# Patient Record
Sex: Male | Born: 1965
Health system: Southern US, Community
[De-identification: ages and names within clinical notes are randomized; demographics above are authoritative.]

## PROBLEM LIST (undated history)

## (undated) DIAGNOSIS — F1010 Alcohol abuse, uncomplicated: Secondary | ICD-10-CM

## (undated) DIAGNOSIS — E785 Hyperlipidemia, unspecified: Secondary | ICD-10-CM

## (undated) DIAGNOSIS — Z9081 Acquired absence of spleen: Secondary | ICD-10-CM

## (undated) DIAGNOSIS — F32A Depression, unspecified: Secondary | ICD-10-CM

## (undated) DIAGNOSIS — F329 Major depressive disorder, single episode, unspecified: Secondary | ICD-10-CM

## (undated) DIAGNOSIS — Z5189 Encounter for other specified aftercare: Secondary | ICD-10-CM

## (undated) HISTORY — PX: WISDOM TOOTH EXTRACTION: SHX21

## (undated) HISTORY — DX: Acquired absence of spleen: Z90.81

## (undated) HISTORY — DX: Hyperlipidemia, unspecified: E78.5

## (undated) HISTORY — DX: Alcohol abuse, uncomplicated: F10.10

## (undated) HISTORY — DX: Major depressive disorder, single episode, unspecified: F32.9

## (undated) HISTORY — DX: Encounter for other specified aftercare: Z51.89

## (undated) HISTORY — DX: Depression, unspecified: F32.A

---
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
## 2015-04-02 ENCOUNTER — Ambulatory Visit (INDEPENDENT_AMBULATORY_CARE_PROVIDER_SITE_OTHER): Payer: 59

## 2015-04-02 ENCOUNTER — Ambulatory Visit (INDEPENDENT_AMBULATORY_CARE_PROVIDER_SITE_OTHER): Payer: 59 | Admitting: Internal Medicine

## 2015-04-02 VITALS — BP 128/82 | HR 63 | Temp 98.4°F | Resp 18 | Ht 70.0 in | Wt 171.0 lb

## 2015-04-02 DIAGNOSIS — M79671 Pain in right foot: Secondary | ICD-10-CM

## 2015-04-02 DIAGNOSIS — S92301A Fracture of unspecified metatarsal bone(s), right foot, initial encounter for closed fracture: Secondary | ICD-10-CM | POA: Diagnosis not present

## 2015-04-02 NOTE — Progress Notes (Signed)
   Subjective:  This chart was scribed for Jerry Pearsonobert P Zhania Shaheen, MD by Jerry Harrison, ED Scribe. The patient was seen in room 11. Patient's care was started at 9:03 AM.   Patient ID: Jerry Harrison, male    DOB: 1966/06/10, 49 y.o.   MRN: [id number]  Chief Complaint  Patient presents with  . Foot Swelling    right foot, pain, x 3 days   HPI HPI Comments: Jerry Harrison is a 49 y.o. male who presents to the [facility] complaining of persistent R foot pain for the past 3 days. Pt states that he tripped and twisted his foot 3 days ago. Pain is exacerbated with bearing weight. He reports associated swelling to his R foot.    History reviewed. No pertinent past medical history. No current outpatient prescriptions on file prior to visit.   No current facility-administered medications on file prior to visit.   No Known Allergies   No PCP--need for care in several yrs  Review of Systems  Musculoskeletal: Positive for joint swelling and arthralgias.  BP 128/82 mmHg  Pulse 63  Temp(Src) 98.4 F (36.9 C) (Oral)  Resp 18  Ht 5\' 10"  (1.778 m)  Wt 171 lb (77.565 kg)  BMI 24.54 kg/m2  SpO2 100%    Objective:   Physical Exam  Constitutional: He is oriented to person, place, and time. He appears well-developed and well-nourished.  HENT:  Head: Normocephalic and atraumatic.  Cardiovascular: Normal rate.   Pulmonary/Chest: Effort normal.  Musculoskeletal: Normal range of motion.  R foot is swollen over the lateral dorsal area with Tenderness to palpation approximately over the forth and fifth MTs. Ankle ROM is full without pain. Achilles intact. Fibula is nontender.   Neurological: He is alert and oriented to person, place, and time.  Skin: Skin is warm and dry.  Psychiatric: He has a normal mood and affect. His behavior is normal.  Nursing note and vitals reviewed. BP 128/82 mmHg  Pulse 63  Temp(Src) 98.4 F (36.9 C) (Oral)  Resp 18  Ht 5\' 10"  (1.778 m)  Wt 171 lb  (77.565 kg)  BMI 24.54 kg/m2  SpO2 100% [facility] reading (PRIMARY) by  Dr. Garth Schlatteroolittle=spiral fx 5th MT      Assessment & Plan:   Fx metatarsal, right, closed, initial encounter - Plan: Ambulatory referral to Orthopedic Surgery  Right foot pain - Plan: DG Foot Complete Right  Dr August Saucerean at 2:30pm

## 2015-11-11 ENCOUNTER — Ambulatory Visit (INDEPENDENT_AMBULATORY_CARE_PROVIDER_SITE_OTHER): Payer: 59 | Admitting: Physician Assistant

## 2015-11-11 ENCOUNTER — Encounter: Payer: Self-pay | Admitting: Physician Assistant

## 2015-11-11 VITALS — BP 132/86 | HR 72 | Temp 98.4°F | Resp 16 | Ht 70.0 in | Wt 175.1 lb

## 2015-11-11 DIAGNOSIS — Z0000 Encounter for general adult medical examination without abnormal findings: Secondary | ICD-10-CM

## 2015-11-11 DIAGNOSIS — Z23 Encounter for immunization: Secondary | ICD-10-CM

## 2015-11-11 NOTE — Patient Instructions (Signed)
Please schedule a lab appointment on your way out. I will call you with your results.  We will treat abnormal findings. Stay well hydrated and stay active.  Follow-up will based on lab results.  Preventive Care for Adults, Male A healthy lifestyle and preventive care can promote health and wellness. Preventive health guidelines for men include the following key practices:  A routine yearly physical is a good way to check with your health care provider about your health and preventative screening. It is a chance to share any concerns and updates on your health and to receive a thorough exam.  Visit your dentist for a routine exam and preventative care every 6 months. Brush your teeth twice a day and floss once a day. Good oral hygiene prevents tooth decay and gum disease.  The frequency of eye exams is based on your age, health, family medical history, use of contact lenses, and other factors. Follow your health care provider's recommendations for frequency of eye exams.  Eat a healthy diet. Foods such as vegetables, fruits, whole grains, low-fat dairy products, and lean protein foods contain the nutrients you need without too many calories. Decrease your intake of foods high in solid fats, added sugars, and salt. Eat the right amount of calories for you.Get information about a proper diet from your health care provider, if necessary.  Regular physical exercise is one of the most important things you can do for your health. Most adults should get at least 150 minutes of moderate-intensity exercise (any activity that increases your heart rate and causes you to sweat) each week. In addition, most adults need muscle-strengthening exercises on 2 or more days a week.  Maintain a healthy weight. The body mass index (BMI) is a screening tool to identify possible weight problems. It provides an estimate of body fat based on height and weight. Your health care provider can find your BMI and can help you  achieve or maintain a healthy weight.For adults 20 years and older:  A BMI below 18.5 is considered underweight.  A BMI of 18.5 to 24.9 is normal.  A BMI of 25 to 29.9 is considered overweight.  A BMI of 30 and above is considered obese.  Maintain normal blood lipids and cholesterol levels by exercising and minimizing your intake of saturated fat. Eat a balanced diet with plenty of fruit and vegetables. Blood tests for lipids and cholesterol should begin at age 20 and be repeated every 5 years. If your lipid or cholesterol levels are high, you are over 50, or you are at high risk for heart disease, you may need your cholesterol levels checked more frequently.Ongoing high lipid and cholesterol levels should be treated with medicines if diet and exercise are not working.  If you smoke, find out from your health care provider how to quit. If you do not use tobacco, do not start.  Lung cancer screening is recommended for adults aged 55-80 years who are at high risk for developing lung cancer because of a history of smoking. A yearly low-dose CT scan of the lungs is recommended for people who have at least a 30-pack-year history of smoking and are a current smoker or have quit within the past 15 years. A pack year of smoking is smoking an average of 1 pack of cigarettes a day for 1 year (for example: 1 pack a day for 30 years or 2 packs a day for 15 years). Yearly screening should continue until the smoker has stopped smoking for   at least 15 years. Yearly screening should be stopped for people who develop a health problem that would prevent them from having lung cancer treatment.  If you choose to drink alcohol, do not have more than 2 drinks per day. One drink is considered to be 12 ounces (355 mL) of beer, 5 ounces (148 mL) of wine, or 1.5 ounces (44 mL) of liquor.  Avoid use of street drugs. Do not share needles with anyone. Ask for help if you need support or instructions about stopping the use of  drugs.  High blood pressure causes heart disease and increases the risk of stroke. Your blood pressure should be checked at least every 1-2 years. Ongoing high blood pressure should be treated with medicines, if weight loss and exercise are not effective.  If you are 45-79 years old, ask your health care provider if you should take aspirin to prevent heart disease.  Diabetes screening is done by taking a blood sample to check your blood glucose level after you have not eaten for a certain period of time (fasting). If you are not overweight and you do not have risk factors for diabetes, you should be screened once every 3 years starting at age 45. If you are overweight or obese and you are 40-70 years of age, you should be screened for diabetes every year as part of your cardiovascular risk assessment.  Colorectal cancer can be detected and often prevented. Most routine colorectal cancer screening begins at the age of 50 and continues through age 75. However, your health care provider may recommend screening at an earlier age if you have risk factors for colon cancer. On a yearly basis, your health care provider may provide home test kits to check for hidden blood in the stool. Use of a small camera at the end of a tube to directly examine the colon (sigmoidoscopy or colonoscopy) can detect the earliest forms of colorectal cancer. Talk to your health care provider about this at age 50, when routine screening begins. Direct exam of the colon should be repeated every 5-10 years through age 75, unless early forms of precancerous polyps or small growths are found.  People who are at an increased risk for hepatitis B should be screened for this virus. You are considered at high risk for hepatitis B if:  You were born in a country where hepatitis B occurs often. Talk with your health care provider about which countries are considered high risk.  Your parents were born in a high-risk country and you have not  received a shot to protect against hepatitis B (hepatitis B vaccine).  You have HIV or AIDS.  You use needles to inject street drugs.  You live with, or have sex with, someone who has hepatitis B.  You are a man who has sex with other men (MSM).  You get hemodialysis treatment.  You take certain medicines for conditions such as cancer, organ transplantation, and autoimmune conditions.  Hepatitis C blood testing is recommended for all people born from 1945 through 1965 and any individual with known risks for hepatitis C.  Practice safe sex. Use condoms and avoid high-risk sexual practices to reduce the spread of sexually transmitted infections (STIs). STIs include gonorrhea, chlamydia, syphilis, trichomonas, herpes, HPV, and human immunodeficiency virus (HIV). Herpes, HIV, and HPV are viral illnesses that have no cure. They can result in disability, cancer, and death.  If you are a man who has sex with other men, you should be screened at   least once per year for:  HIV.  Urethral, rectal, and pharyngeal infection of gonorrhea, chlamydia, or both.  If you are at risk of being infected with HIV, it is recommended that you take a prescription medicine daily to prevent HIV infection. This is called preexposure prophylaxis (PrEP). You are considered at risk if:  You are a man who has sex with other men (MSM) and have other risk factors.  You are a heterosexual man, are sexually active, and are at increased risk for HIV infection.  You take drugs by injection.  You are sexually active with a partner who has HIV.  Talk with your health care provider about whether you are at high risk of being infected with HIV. If you choose to begin PrEP, you should first be tested for HIV. You should then be tested every 3 months for as long as you are taking PrEP.  A one-time screening for abdominal aortic aneurysm (AAA) and surgical repair of large AAAs by ultrasound are recommended for men ages 65 to  75 years who are current or former smokers.  Healthy men should no longer receive prostate-specific antigen (PSA) blood tests as part of routine cancer screening. Talk with your health care provider about prostate cancer screening.  Testicular cancer screening is not recommended for adult males who have no symptoms. Screening includes self-exam, a health care provider exam, and other screening tests. Consult with your health care provider about any symptoms you have or any concerns you have about testicular cancer.  Use sunscreen. Apply sunscreen liberally and repeatedly throughout the day. You should seek shade when your shadow is shorter than you. Protect yourself by wearing long sleeves, pants, a wide-brimmed hat, and sunglasses year round, whenever you are outdoors.  Once a month, do a whole-body skin exam, using a mirror to look at the skin on your back. Tell your health care provider about new moles, moles that have irregular borders, moles that are larger than a pencil eraser, or moles that have changed in shape or color.  Stay current with required vaccines (immunizations).  Influenza vaccine. All adults should be immunized every year.  Tetanus, diphtheria, and acellular pertussis (Td, Tdap) vaccine. An adult who has not previously received Tdap or who does not know his vaccine status should receive 1 dose of Tdap. This initial dose should be followed by tetanus and diphtheria toxoids (Td) booster doses every 10 years. Adults with an unknown or incomplete history of completing a 3-dose immunization series with Td-containing vaccines should begin or complete a primary immunization series including a Tdap dose. Adults should receive a Td booster every 10 years.  Varicella vaccine. An adult without evidence of immunity to varicella should receive 2 doses or a second dose if he has previously received 1 dose.  Human papillomavirus (HPV) vaccine. Males aged 11-21 years who have not received the  vaccine previously should receive the 3-dose series. Males aged 22-26 years may be immunized. Immunization is recommended through the age of 26 years for any male who has sex with males and did not get any or all doses earlier. Immunization is recommended for any person with an immunocompromised condition through the age of 26 years if he did not get any or all doses earlier. During the 3-dose series, the second dose should be obtained 4-8 weeks after the first dose. The third dose should be obtained 24 weeks after the first dose and 16 weeks after the second dose.  Zoster vaccine. One dose is   recommended for adults aged 60 years or older unless certain conditions are present.  Measles, mumps, and rubella (MMR) vaccine. Adults born before 1957 generally are considered immune to measles and mumps. Adults born in 1957 or later should have 1 or more doses of MMR vaccine unless there is a contraindication to the vaccine or there is laboratory evidence of immunity to each of the three diseases. A routine second dose of MMR vaccine should be obtained at least 28 days after the first dose for students attending postsecondary schools, health care workers, or international travelers. People who received inactivated measles vaccine or an unknown type of measles vaccine during 1963-1967 should receive 2 doses of MMR vaccine. People who received inactivated mumps vaccine or an unknown type of mumps vaccine before 1979 and are at high risk for mumps infection should consider immunization with 2 doses of MMR vaccine. Unvaccinated health care workers born before 1957 who lack laboratory evidence of measles, mumps, or rubella immunity or laboratory confirmation of disease should consider measles and mumps immunization with 2 doses of MMR vaccine or rubella immunization with 1 dose of MMR vaccine.  Pneumococcal 13-valent conjugate (PCV13) vaccine. When indicated, a person who is uncertain of his immunization history and has no  record of immunization should receive the PCV13 vaccine. All adults 65 years of age and older should receive this vaccine. An adult aged 19 years or older who has certain medical conditions and has not been previously immunized should receive 1 dose of PCV13 vaccine. This PCV13 should be followed with a dose of pneumococcal polysaccharide (PPSV23) vaccine. Adults who are at high risk for pneumococcal disease should obtain the PPSV23 vaccine at least 8 weeks after the dose of PCV13 vaccine. Adults older than 48 years of age who have normal immune system function should obtain the PPSV23 vaccine dose at least 1 year after the dose of PCV13 vaccine.  Pneumococcal polysaccharide (PPSV23) vaccine. When PCV13 is also indicated, PCV13 should be obtained first. All adults aged 65 years and older should be immunized. An adult younger than age 65 years who has certain medical conditions should be immunized. Any person who resides in a nursing home or long-term care facility should be immunized. An adult smoker should be immunized. People with an immunocompromised condition and certain other conditions should receive both PCV13 and PPSV23 vaccines. People with human immunodeficiency virus (HIV) infection should be immunized as soon as possible after diagnosis. Immunization during chemotherapy or radiation therapy should be avoided. Routine use of PPSV23 vaccine is not recommended for American Indians, [place] Natives, or people younger than 65 years unless there are medical conditions that require PPSV23 vaccine. When indicated, people who have unknown immunization and have no record of immunization should receive PPSV23 vaccine. One-time revaccination 5 years after the first dose of PPSV23 is recommended for people aged 19-64 years who have chronic kidney failure, nephrotic syndrome, asplenia, or immunocompromised conditions. People who received 1-2 doses of PPSV23 before age 65 years should receive another dose of PPSV23  vaccine at age 65 years or later if at least 5 years have passed since the previous dose. Doses of PPSV23 are not needed for people immunized with PPSV23 at or after age 65 years.  Meningococcal vaccine. Adults with asplenia or persistent complement component deficiencies should receive 2 doses of quadrivalent meningococcal conjugate (MenACWY-D) vaccine. The doses should be obtained at least 2 months apart. Microbiologists working with certain meningococcal bacteria, military recruits, people at risk during an outbreak, and   people who travel to or live in countries with a high rate of meningitis should be immunized. A first-year college student up through age 21 years who is living in a residence hall should receive a dose if he did not receive a dose on or after his 16th birthday. Adults who have certain high-risk conditions should receive one or more doses of vaccine.  Hepatitis A vaccine. Adults who wish to be protected from this disease, have chronic liver disease, work with hepatitis A-infected animals, work in hepatitis A research labs, or travel to or work in countries with a high rate of hepatitis A should be immunized. Adults who were previously unvaccinated and who anticipate close contact with an international adoptee during the first 60 days after arrival in the [place] from a country with a high rate of hepatitis A should be immunized.  Hepatitis B vaccine. Adults should be immunized if they wish to be protected from this disease, are under age 27 years and have diabetes, have chronic liver disease, have had more than one sex partner in the past 6 months, may be exposed to blood or other infectious body fluids, are household contacts or sex partners of hepatitis B positive people, are clients or workers in certain care facilities, or travel to or work in countries with a high rate of hepatitis B.  Haemophilus influenzae type b (Hib) vaccine. A previously unvaccinated person with asplenia  or sickle cell disease or having a scheduled splenectomy should receive 1 dose of Hib vaccine. Regardless of previous immunization, a recipient of a hematopoietic stem cell transplant should receive a 3-dose series 6-12 months after his successful transplant. Hib vaccine is not recommended for adults with HIV infection. Preventive Service / Frequency Ages 34 to 23  Blood pressure check.** / Every 3-5 years.  Lipid and cholesterol check.** / Every 5 years beginning at age 68.  Hepatitis C blood test.** / For any individual with known risks for hepatitis C.  Skin self-exam. / Monthly.  Influenza vaccine. / Every year.  Tetanus, diphtheria, and acellular pertussis (Tdap, Td) vaccine.** / Consult your health care provider. 1 dose of Td every 10 years.  Varicella vaccine.** / Consult your health care provider.  HPV vaccine. / 3 doses over 6 months, if 27 or younger.  Measles, mumps, rubella (MMR) vaccine.** / You need at least 1 dose of MMR if you were born in 1957 or later. You may also need a second dose.  Pneumococcal 13-valent conjugate (PCV13) vaccine.** / Consult your health care provider.  Pneumococcal polysaccharide (PPSV23) vaccine.** / 1 to 2 doses if you smoke cigarettes or if you have certain conditions.  Meningococcal vaccine.** / 1 dose if you are age 42 to 69 years and a Market researcher living in a residence hall, or have one of several medical conditions. You may also need additional booster doses.  Hepatitis A vaccine.** / Consult your health care provider.  Hepatitis B vaccine.** / Consult your health care provider.  Haemophilus influenzae type b (Hib) vaccine.** / Consult your health care provider. Ages 55 to 38  Blood pressure check.** / Every year.  Lipid and cholesterol check.** / Every 5 years beginning at age 40.  Lung cancer screening. / Every year if you are aged 39-80 years and have a 30-pack-year history of smoking and currently smoke or have  quit within the past 15 years. Yearly screening is stopped once you have quit smoking for at least 15 years or develop a health  problem that would prevent you from having lung cancer treatment.  Fecal occult blood test (FOBT) of stool. / Every year beginning at age 44 and continuing until age 25. You may not have to do this test if you get a colonoscopy every 10 years.  Flexible sigmoidoscopy** or colonoscopy.** / Every 5 years for a flexible sigmoidoscopy or every 10 years for a colonoscopy beginning at age 29 and continuing until age 81.  Hepatitis C blood test.** / For all people born from [birth year] through 1965 and any individual with known risks for hepatitis C.  Skin self-exam. / Monthly.  Influenza vaccine. / Every year.  Tetanus, diphtheria, and acellular pertussis (Tdap/Td) vaccine.** / Consult your health care provider. 1 dose of Td every 10 years.  Varicella vaccine.** / Consult your health care provider.  Zoster vaccine.** / 1 dose for adults aged 59 years or older.  Measles, mumps, rubella (MMR) vaccine.** / You need at least 1 dose of MMR if you were born in 1957 or later. You may also need a second dose.  Pneumococcal 13-valent conjugate (PCV13) vaccine.** / Consult your health care provider.  Pneumococcal polysaccharide (PPSV23) vaccine.** / 1 to 2 doses if you smoke cigarettes or if you have certain conditions.  Meningococcal vaccine.** / Consult your health care provider.  Hepatitis A vaccine.** / Consult your health care provider.  Hepatitis B vaccine.** / Consult your health care provider.  Haemophilus influenzae type b (Hib) vaccine.** / Consult your health care provider. Ages 61 and over  Blood pressure check.** / Every year.  Lipid and cholesterol check.**/ Every 5 years beginning at age 53.  Lung cancer screening. / Every year if you are aged 31-80 years and have a 30-pack-year history of smoking and currently smoke or have quit within the past 15 years. Yearly  screening is stopped once you have quit smoking for at least 15 years or develop a health problem that would prevent you from having lung cancer treatment.  Fecal occult blood test (FOBT) of stool. / Every year beginning at age 6 and continuing until age 56. You may not have to do this test if you get a colonoscopy every 10 years.  Flexible sigmoidoscopy** or colonoscopy.** / Every 5 years for a flexible sigmoidoscopy or every 10 years for a colonoscopy beginning at age 63 and continuing until age 21.  Hepatitis C blood test.** / For all people born from [birth year] through 1965 and any individual with known risks for hepatitis C.  Abdominal aortic aneurysm (AAA) screening.** / A one-time screening for ages 9 to 6 years who are current or former smokers.  Skin self-exam. / Monthly.  Influenza vaccine. / Every year.  Tetanus, diphtheria, and acellular pertussis (Tdap/Td) vaccine.** / 1 dose of Td every 10 years.  Varicella vaccine.** / Consult your health care provider.  Zoster vaccine.** / 1 dose for adults aged 72 years or older.  Pneumococcal 13-valent conjugate (PCV13) vaccine.** / 1 dose for all adults aged 79 years and older.  Pneumococcal polysaccharide (PPSV23) vaccine.** / 1 dose for all adults aged 36 years and older.  Meningococcal vaccine.** / Consult your health care provider.  Hepatitis A vaccine.** / Consult your health care provider.  Hepatitis B vaccine.** / Consult your health care provider.  Haemophilus influenzae type b (Hib) vaccine.** / Consult your health care provider. **Family history and personal history of risk and conditions may change your health care provider's recommendations.   This information is not intended to replace advice given  to you by your health care provider. Make sure you discuss any questions you have with your health care provider.   Document Released: 02/09/2002 Document Revised: 01/04/2015 Document Reviewed: 05/11/2011 Elsevier  Interactive Patient Education 2016 [employer]  

## 2015-11-11 NOTE — Assessment & Plan Note (Signed)
Flu shot given by nursing staff. 

## 2015-11-11 NOTE — Progress Notes (Signed)
Pre visit review using our clinic review tool, if applicable. No additional management support is needed unless otherwise documented below in the visit note/SLS  

## 2015-11-11 NOTE — Assessment & Plan Note (Signed)
Depression screen negative. Health Maintenance reviewed -- Flu shot today. Unsure of Tetanus. Will get records from previous PCP. Preventive schedule discussed and handout given in AVS. Will obtain fasting labs today.
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
## 2015-11-12 ENCOUNTER — Other Ambulatory Visit: Payer: Self-pay | Admitting: Physician Assistant

## 2015-11-12 DIAGNOSIS — Z299 Encounter for prophylactic measures, unspecified: Secondary | ICD-10-CM

## 2015-11-13 ENCOUNTER — Other Ambulatory Visit (INDEPENDENT_AMBULATORY_CARE_PROVIDER_SITE_OTHER): Payer: 59

## 2015-11-13 DIAGNOSIS — Z0000 Encounter for general adult medical examination without abnormal findings: Secondary | ICD-10-CM

## 2015-11-13 DIAGNOSIS — Z299 Encounter for prophylactic measures, unspecified: Secondary | ICD-10-CM

## 2015-11-13 LAB — HEPATIC FUNCTION PANEL
ALT: 12 U/L (ref 0–53)
AST: 15 U/L (ref 0–37)
Albumin: 4.1 g/dL (ref 3.5–5.2)
Alkaline Phosphatase: 82 U/L (ref 39–117)
BILIRUBIN DIRECT: 0.1 mg/dL (ref 0.0–0.3)
BILIRUBIN TOTAL: 0.6 mg/dL (ref 0.2–1.2)
TOTAL PROTEIN: 6.9 g/dL (ref 6.0–8.3)

## 2015-11-13 LAB — URINALYSIS, ROUTINE W REFLEX MICROSCOPIC
Bilirubin Urine: NEGATIVE
KETONES UR: NEGATIVE
LEUKOCYTES UA: NEGATIVE
Nitrite: NEGATIVE
SPECIFIC GRAVITY, URINE: 1.025 (ref 1.000–1.030)
TOTAL PROTEIN, URINE-UPE24: NEGATIVE
URINE GLUCOSE: NEGATIVE
UROBILINOGEN UA: 0.2 (ref 0.0–1.0)
pH: 5.5 (ref 5.0–8.0)

## 2015-11-13 LAB — BASIC METABOLIC PANEL
BUN: 17 mg/dL (ref 6–23)
CALCIUM: 9.5 mg/dL (ref 8.4–10.5)
CO2: 29 mEq/L (ref 19–32)
CREATININE: 1.09 mg/dL (ref 0.40–1.50)
Chloride: 106 mEq/L (ref 96–112)
GFR: 76.54 mL/min (ref >60.00)
Glucose, Bld: 95 mg/dL (ref 70–99)
Potassium: 4.7 mEq/L (ref 3.5–5.1)
Sodium: 143 mEq/L (ref 135–145)

## 2015-11-13 LAB — CBC
HCT: 45.3 % (ref 39.0–52.0)
Hemoglobin: 14.8 g/dL (ref 13.0–17.0)
MCHC: 32.7 g/dL (ref 30.0–36.0)
MCV: 93.5 fl (ref 78.0–100.0)
PLATELETS: 360 10*3/uL (ref 150.0–400.0)
RBC: 4.84 Mil/uL (ref 4.22–5.81)
RDW: 13.9 % (ref 11.5–15.5)
WBC: 8.9 10*3/uL (ref 4.0–10.5)

## 2015-11-13 LAB — LIPID PANEL
CHOLESTEROL: 178 mg/dL (ref 0–200)
HDL: 51.1 mg/dL (ref >39.00)
LDL CALC: 115 mg/dL — AB (ref 0–99)
NONHDL: 126.52
Total CHOL/HDL Ratio: 3
Triglycerides: 58 mg/dL (ref 0.0–149.0)
VLDL: 11.6 mg/dL (ref 0.0–40.0)

## 2015-11-13 LAB — HEMOGLOBIN A1C: HEMOGLOBIN A1C: 5.9 % (ref 4.6–6.5)

## 2015-11-14 ENCOUNTER — Telehealth: Payer: Self-pay

## 2015-11-14 NOTE — Telephone Encounter (Signed)
Called patient,voicemail not set up yet. Will call again with lab results.

## 2015-11-14 NOTE — Telephone Encounter (Signed)
-----   Message from Waldon MerlWilliam C Martin, PA-C sent at 11/14/2015  7:41 AM EST ----- Labs look great overall. Cholesterol overall looks good but LDL (bad) cholesterol is borderline high. Recommend increased aerobic exercise. Limit foods high in cholesterol and saturated fats. We will check this yearly. Urine good overall but is showing rare amount of kidney cells which is an uncommon finding. Do not think this is anything worrisome but would like for him to return to lab in 1 week (well-hydrated) to give a repeat UA just to double check things.

## 2015-11-20 ENCOUNTER — Other Ambulatory Visit (INDEPENDENT_AMBULATORY_CARE_PROVIDER_SITE_OTHER): Payer: 59

## 2015-11-20 DIAGNOSIS — R8290 Unspecified abnormal findings in urine: Secondary | ICD-10-CM | POA: Diagnosis not present

## 2015-11-20 LAB — URINALYSIS, ROUTINE W REFLEX MICROSCOPIC
BILIRUBIN URINE: NEGATIVE
KETONES UR: NEGATIVE
Leukocytes, UA: NEGATIVE
NITRITE: NEGATIVE
PH: 6 (ref 5.0–8.0)
Specific Gravity, Urine: 1.015 (ref 1.000–1.030)
Total Protein, Urine: NEGATIVE
UROBILINOGEN UA: 0.2 (ref 0.0–1.0)
Urine Glucose: NEGATIVE
WBC UA: NONE SEEN (ref 0–?)

## 2015-11-20 NOTE — Telephone Encounter (Signed)
Called patient with lab results. Came in today for repeat urine.

## 2015-12-10 ENCOUNTER — Telehealth: Payer: Self-pay | Admitting: Physician Assistant

## 2015-12-10 DIAGNOSIS — S4990XA Unspecified injury of shoulder and upper arm, unspecified arm, initial encounter: Secondary | ICD-10-CM

## 2015-12-10 NOTE — Telephone Encounter (Signed)
Caller name: Self   Can be reached:   Reason for call: Patient request a new referral to go back to Dr. August Saucerean (Ortho). States he fell and hurt his shoulder

## 2015-12-10 NOTE — Telephone Encounter (Signed)
Referral placed for patient.  
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
## 2017-07-19 ENCOUNTER — Encounter: Payer: Self-pay | Admitting: Physician Assistant

## 2017-07-19 ENCOUNTER — Ambulatory Visit (INDEPENDENT_AMBULATORY_CARE_PROVIDER_SITE_OTHER): Payer: 59 | Admitting: Physician Assistant

## 2017-07-19 ENCOUNTER — Other Ambulatory Visit (INDEPENDENT_AMBULATORY_CARE_PROVIDER_SITE_OTHER): Payer: 59

## 2017-07-19 VITALS — BP 138/82 | HR 65 | Temp 98.3°F | Resp 14 | Ht 70.0 in | Wt 180.0 lb

## 2017-07-19 DIAGNOSIS — Z23 Encounter for immunization: Secondary | ICD-10-CM

## 2017-07-19 DIAGNOSIS — Z0000 Encounter for general adult medical examination without abnormal findings: Secondary | ICD-10-CM | POA: Diagnosis not present

## 2017-07-19 DIAGNOSIS — Z1211 Encounter for screening for malignant neoplasm of colon: Secondary | ICD-10-CM | POA: Diagnosis not present

## 2017-07-19 DIAGNOSIS — Z125 Encounter for screening for malignant neoplasm of prostate: Secondary | ICD-10-CM

## 2017-07-19 LAB — CBC WITH DIFFERENTIAL/PLATELET
BASOS PCT: 1.2 % (ref 0.0–3.0)
Basophils Absolute: 0.1 10*3/uL (ref 0.0–0.1)
EOS PCT: 0.5 % (ref 0.0–5.0)
Eosinophils Absolute: 0.1 10*3/uL (ref 0.0–0.7)
HEMATOCRIT: 41.9 % (ref 39.0–52.0)
HEMOGLOBIN: 13.7 g/dL (ref 13.0–17.0)
Lymphocytes Relative: 25.7 % (ref 12.0–46.0)
Lymphs Abs: 2.5 10*3/uL (ref 0.7–4.0)
MCHC: 32.8 g/dL (ref 30.0–36.0)
MCV: 93.9 fl (ref 78.0–100.0)
MONOS PCT: 7.7 % (ref 3.0–12.0)
Monocytes Absolute: 0.8 10*3/uL (ref 0.1–1.0)
Neutro Abs: 6.4 10*3/uL (ref 1.4–7.7)
Neutrophils Relative %: 64.9 % (ref 43.0–77.0)
Platelets: 351 10*3/uL (ref 150.0–400.0)
RBC: 4.46 Mil/uL (ref 4.22–5.81)
RDW: 13.3 % (ref 11.5–15.5)
WBC: 9.9 10*3/uL (ref 4.0–10.5)

## 2017-07-19 LAB — COMPREHENSIVE METABOLIC PANEL
ALBUMIN: 4 g/dL (ref 3.5–5.2)
ALK PHOS: 83 U/L (ref 39–117)
ALT: 11 U/L (ref 0–53)
AST: 14 U/L (ref 0–37)
BUN: 12 mg/dL (ref 6–23)
CALCIUM: 9 mg/dL (ref 8.4–10.5)
CHLORIDE: 102 meq/L (ref 96–112)
CO2: 30 mEq/L (ref 19–32)
Creatinine, Ser: 1 mg/dL (ref 0.40–1.50)
GFR: 83.96 mL/min (ref >60.00)
Glucose, Bld: 116 mg/dL — ABNORMAL HIGH (ref 70–99)
POTASSIUM: 4.6 meq/L (ref 3.5–5.1)
Sodium: 140 mEq/L (ref 135–145)
TOTAL PROTEIN: 6.5 g/dL (ref 6.0–8.3)
Total Bilirubin: 0.5 mg/dL (ref 0.2–1.2)

## 2017-07-19 LAB — LIPID PANEL
CHOLESTEROL: 214 mg/dL — AB (ref 0–200)
HDL: 64.9 mg/dL (ref >39.00)
LDL CALC: 136 mg/dL — AB (ref 0–99)
NonHDL: 149.38
TRIGLYCERIDES: 65 mg/dL (ref 0.0–149.0)
Total CHOL/HDL Ratio: 3
VLDL: 13 mg/dL (ref 0.0–40.0)

## 2017-07-19 LAB — TSH: TSH: 1.44 u[IU]/mL (ref 0.35–4.50)

## 2017-07-19 LAB — URINALYSIS, ROUTINE W REFLEX MICROSCOPIC
Bilirubin Urine: NEGATIVE
HGB URINE DIPSTICK: NEGATIVE
Ketones, ur: NEGATIVE
LEUKOCYTES UA: NEGATIVE
Nitrite: NEGATIVE
Specific Gravity, Urine: <=1.005 — AB (ref 1.000–1.030)
Total Protein, Urine: NEGATIVE
URINE GLUCOSE: NEGATIVE
UROBILINOGEN UA: 0.2 (ref 0.0–1.0)
pH: 6 (ref 5.0–8.0)

## 2017-07-19 LAB — HEMOGLOBIN A1C: Hgb A1c MFr Bld: 5.8 % (ref 4.6–6.5)

## 2017-07-19 LAB — PSA: PSA: 0.51 ng/mL (ref 0.10–4.00)

## 2017-07-19 NOTE — Assessment & Plan Note (Signed)
Depression screen negative. Health Maintenance reviewed. TdaP updated today.  The natural history of prostate cancer and ongoing controversy regarding screening and potential treatment outcomes of prostate cancer has been discussed with the patient. The meaning of a false positive PSA and a false negative PSA has been discussed. He indicates understanding of the limitations of this screening test and wishes to proceed with screening PSA testing. . Preventive schedule discussed and handout given in AVS. Will obtain fasting labs today.

## 2017-07-19 NOTE — Patient Instructions (Signed)
Please go to the lab for blood work. Our lab is closed today, so please speak with Levada Dy for instructions to the [facility] lab.  Our office will call you with your results unless you have chosen to receive results via MyChart.  If your blood work is normal we will follow-up each year for physicals and as scheduled for chronic medical problems.  If anything is abnormal we will treat accordingly and get you in for a follow-up.   Preventive Care 40-64 Years, Male Preventive care refers to lifestyle choices and visits with your health care provider that can promote health and wellness. What does preventive care include?  A yearly physical exam. This is also called an annual well check.  Dental exams once or twice a year.  Routine eye exams. Ask your health care provider how often you should have your eyes checked.  Personal lifestyle choices, including: ? Daily care of your teeth and gums. ? Regular physical activity. ? Eating a healthy diet. ? Avoiding tobacco and drug use. ? Limiting alcohol use. ? Practicing safe sex. ? Taking low-dose aspirin every day starting at age 27. What happens during an annual well check? The services and screenings done by your health care provider during your annual well check will depend on your age, overall health, lifestyle risk factors, and family history of disease. Counseling Your health care provider may ask you questions about your:  Alcohol use.  Tobacco use.  Drug use.  Emotional well-being.  Home and relationship well-being.  Sexual activity.  Eating habits.  Work and work Statistician.  Screening You may have the following tests or measurements:  Height, weight, and BMI.  Blood pressure.  Lipid and cholesterol levels. These may be checked every 5 years, or more frequently if you are over 60 years old.  Skin check.  Lung cancer screening. You may have this screening every year starting at age 14 if you have a  30-pack-year history of smoking and currently smoke or have quit within the past 15 years.  Fecal occult blood test (FOBT) of the stool. You may have this test every year starting at age 50.  Flexible sigmoidoscopy or colonoscopy. You may have a sigmoidoscopy every 5 years or a colonoscopy every 10 years starting at age 12.  Prostate cancer screening. Recommendations will vary depending on your family history and other risks.  Hepatitis C blood test.  Hepatitis B blood test.  Sexually transmitted disease (STD) testing.  Diabetes screening. This is done by checking your blood sugar (glucose) after you have not eaten for a while (fasting). You may have this done every 1-3 years.  Discuss your test results, treatment options, and if necessary, the need for more tests with your health care provider. Vaccines Your health care provider may recommend certain vaccines, such as:  Influenza vaccine. This is recommended every year.  Tetanus, diphtheria, and acellular pertussis (Tdap, Td) vaccine. You may need a Td booster every 10 years.  Varicella vaccine. You may need this if you have not been vaccinated.  Zoster vaccine. You may need this after age 58.  Measles, mumps, and rubella (MMR) vaccine. You may need at least one dose of MMR if you were born in 1957 or later. You may also need a second dose.  Pneumococcal 13-valent conjugate (PCV13) vaccine. You may need this if you have certain conditions and have not been vaccinated.  Pneumococcal polysaccharide (PPSV23) vaccine. You may need one or two doses if you smoke cigarettes or  if you have certain conditions.  Meningococcal vaccine. You may need this if you have certain conditions.  Hepatitis A vaccine. You may need this if you have certain conditions or if you travel or work in places where you may be exposed to hepatitis A.  Hepatitis B vaccine. You may need this if you have certain conditions or if you travel or work in places where  you may be exposed to hepatitis B.  Haemophilus influenzae type b (Hib) vaccine. You may need this if you have certain risk factors.  Talk to your health care provider about which screenings and vaccines you need and how often you need them. This information is not intended to replace advice given to you by your health care provider. Make sure you discuss any questions you have with your health care provider. Document Released: 01/10/2016 Document Revised: 09/02/2016 Document Reviewed: 10/15/2015 Elsevier Interactive Patient Education  2017 [employer].

## 2017-07-19 NOTE — Progress Notes (Signed)
Pre visit review using our clinic review tool, if applicable. No additional management support is needed unless otherwise documented below in the visit note. 

## 2017-07-19 NOTE — Assessment & Plan Note (Signed)
Referral to GI placed for screening colonoscopy.  

## 2017-07-26 ENCOUNTER — Encounter: Payer: Self-pay | Admitting: Internal Medicine

## 2017-08-24 ENCOUNTER — Ambulatory Visit (AMBULATORY_SURGERY_CENTER): Payer: Self-pay | Admitting: *Deleted

## 2017-08-24 VITALS — Ht 70.0 in | Wt 178.6 lb

## 2017-08-24 DIAGNOSIS — Z800 Family history of malignant neoplasm of digestive organs: Secondary | ICD-10-CM

## 2017-08-24 NOTE — Progress Notes (Signed)
No egg or soy allergy known to patient  No issues with past sedation with any surgeries  or procedures, no intubation problems  No diet pills per patient No home 02 use per patient  No blood thinners per patient  Pt denies issues with constipation - NO MEDS BUT OCC HAS ISSUES - HARD STOOLS, GOES 4-5 TIMES A WEEK - will do a 2 day prep  No A fib or A flutter   EMMI video sent to pt's e mail

## 2017-08-31 ENCOUNTER — Encounter: Payer: Self-pay | Admitting: Internal Medicine

## 2017-09-07 ENCOUNTER — Ambulatory Visit (AMBULATORY_SURGERY_CENTER): Payer: 59 | Admitting: Internal Medicine

## 2017-09-07 ENCOUNTER — Encounter: Payer: Self-pay | Admitting: Internal Medicine

## 2017-09-07 VITALS — BP 123/71 | HR 68 | Temp 97.8°F | Resp 14 | Ht 70.0 in | Wt 178.0 lb

## 2017-09-07 DIAGNOSIS — Z1211 Encounter for screening for malignant neoplasm of colon: Secondary | ICD-10-CM | POA: Diagnosis not present

## 2017-09-07 DIAGNOSIS — Z1212 Encounter for screening for malignant neoplasm of rectum: Secondary | ICD-10-CM

## 2017-09-07 DIAGNOSIS — Z800 Family history of malignant neoplasm of digestive organs: Secondary | ICD-10-CM | POA: Diagnosis not present

## 2017-09-07 MED ORDER — SODIUM CHLORIDE 0.9 % IV SOLN: 500.0000 mL | INTRAVENOUS | Status: DC

## 2017-09-07 NOTE — Patient Instructions (Addendum)
   No polyps, no cancer detected.  Next routine colonoscopy or other screening test in 10 years - 2028  I appreciate the opportunity to care for you. Iva Booparl E. Gessner, MD, [facility]   Discharge instructions given. Normal exam. Resume previous medications. YOU HAD AN ENDOSCOPIC PROCEDURE TODAY AT THE [facility]:   Refer to the procedure report that was given to you for any specific questions about what was found during the examination.  If the procedure report does not answer your questions, please call your gastroenterologist to clarify.  If you requested that your care partner not be given the details of your procedure findings, then the procedure report has been included in a sealed envelope for you to review at your convenience later.  YOU SHOULD EXPECT: Some feelings of bloating in the abdomen. Passage of more gas than usual.  Walking can help get rid of the air that was put into your GI tract during the procedure and reduce the bloating. If you had a lower endoscopy (such as a colonoscopy or flexible sigmoidoscopy) you may notice spotting of blood in your stool or on the toilet paper. If you underwent a bowel prep for your procedure, you may not have a normal bowel movement for a few days.  Please Note:  You might notice some irritation and congestion in your nose or some drainage.  This is from the oxygen used during your procedure.  There is no need for concern and it should clear up in a day or so.  SYMPTOMS TO REPORT IMMEDIATELY:   Following lower endoscopy (colonoscopy or flexible sigmoidoscopy):  Excessive amounts of blood in the stool  Significant tenderness or worsening of abdominal pains  Swelling of the abdomen that is new, acute  Fever of 100F or higher   For urgent or emergent issues, a gastroenterologist can be reached at any hour by calling [phone].   DIET:  We do recommend a small meal at first, but then you may proceed to your regular diet.   Drink plenty of fluids but you should avoid alcoholic beverages for 24 hours.  ACTIVITY:  You should plan to take it easy for the rest of today and you should NOT DRIVE or use heavy machinery until tomorrow (because of the sedation medicines used during the test).    FOLLOW UP: Our staff will call the number listed on your records the next business day following your procedure to check on you and address any questions or concerns that you may have regarding the information given to you following your procedure. If we do not reach you, we will leave a message.  However, if you are feeling well and you are not experiencing any problems, there is no need to return our call.  We will assume that you have returned to your regular daily activities without incident.  If any biopsies were taken you will be contacted by phone or by letter within the next 1-3 weeks.  Please call us at [phone] if you have not heard about the biopsies in 3 weeks.    SIGNATURES/CONFIDENTIALITY: You and/or your care partner have signed paperwork which will be entered into your electronic medical record.  These signatures attest to the fact that that the information above on your After Visit Summary has been reviewed and is understood.  Full responsibility of the confidentiality of this discharge information lies with you and/or your care-partner.

## 2017-09-07 NOTE — Op Note (Signed)
[facility] Patient Name: Jerry Harrison Procedure Date: 09/07/2017 2:49 PM MRN: [id number] Endoscopist: Iva Boop , MD Age: 51 Referring MD:  Date of Birth: 1966/01/10 Gender: Male Account #: [id number] Procedure:                Colonoscopy Indications:              Screening for colorectal malignant neoplasm, This                            is the patient's first colonoscopy Medicines:                Propofol per Anesthesia, Monitored Anesthesia Care Procedure:                Pre-Anesthesia Assessment:                           - Prior to the procedure, a History and Physical                            was performed, and patient medications and                            allergies were reviewed. The patient's tolerance of                            previous anesthesia was also reviewed. The risks                            and benefits of the procedure and the sedation                            options and risks were discussed with the patient.                            All questions were answered, and informed consent                            was obtained. Prior Anticoagulants: The patient has                            taken no previous anticoagulant or antiplatelet                            agents. ASA Grade Assessment: II - A patient with                            mild systemic disease. After reviewing the risks                            and benefits, the patient was deemed in                            satisfactory condition to undergo the procedure.  After obtaining informed consent, the colonoscope                            was passed under direct vision. Throughout the                            procedure, the patient's blood pressure, pulse, and                            oxygen saturations were monitored continuously. The                            Colonoscope was introduced through the anus and   advanced to the the cecum, identified by                            appendiceal orifice and ileocecal valve. The                            colonoscopy was performed without difficulty. The                            patient tolerated the procedure well. The quality                            of the bowel preparation was adequate. The                            ileocecal valve, appendiceal orifice, and rectum                            were photographed. The bowel preparation used was                            Miralax. Scope In: 3:03:38 PM Scope Out: 3:17:59 PM Scope Withdrawal Time: 0 hours 10 minutes 19 seconds  Total Procedure Duration: 0 hours 14 minutes 21 seconds  Findings:                 The perianal examination was normal.                           The digital rectal exam findings include decreased                            sphincter tone. Pertinent negatives include normal                            prostate (size, shape, and consistency).                           The entire examined colon appeared normal on direct                            and retroflexion views. Complications:  No immediate complications. Estimated Blood Loss:     Estimated blood loss: none. Impression:               - Decreased sphincter tone found on digital rectal                            exam.                           - The entire examined colon is normal on direct and                            retroflexion views.                           - No specimens collected. Recommendation:           - Patient has a contact number available for                            emergencies. The signs and symptoms of potential                            delayed complications were discussed with the                            patient. Return to normal activities tomorrow.                            Written discharge instructions were provided to the                            patient.                            - Resume previous diet.                           - Continue present medications.                           - Repeat colonoscopy in 10 years for screening                            purposes. Father had colon cancer but I believe >                            60 so 10 year interval Iva Boop, MD 09/07/2017 3:24:32 PM This report has been signed electronically.

## 2017-09-07 NOTE — Progress Notes (Signed)
Spontaneous respirations throughout. VSS. Resting comfortably. To PACU on room air. Report to  RN. 

## 2017-09-08 ENCOUNTER — Telehealth: Payer: Self-pay

## 2017-09-08 NOTE — Telephone Encounter (Signed)
  Follow up Call-  Call Jerry Harrison number 09/07/2017  Post procedure Call [name] phone  # [phone]  Permission to leave phone message No  Some recent data might be hidden     Patient questions:  Do you have a fever, pain , or abdominal swelling? No. Pain Score  0 *  Have you tolerated food without any problems? Yes.    Have you been able to return to your normal activities? Yes.    Do you have any questions about your discharge instructions: Diet   No. Medications  No. Follow up visit  No.  Do you have questions or concerns about your Care? No.  Actions: * If pain score is 4 or above: No action needed, pain <4.

## 2017-11-02 ENCOUNTER — Encounter: Payer: Self-pay | Admitting: Physician Assistant

## 2017-11-02 ENCOUNTER — Ambulatory Visit (INDEPENDENT_AMBULATORY_CARE_PROVIDER_SITE_OTHER): Payer: 59

## 2017-11-02 ENCOUNTER — Ambulatory Visit (INDEPENDENT_AMBULATORY_CARE_PROVIDER_SITE_OTHER): Payer: 59 | Admitting: Physician Assistant

## 2017-11-02 VITALS — BP 118/80 | HR 83 | Temp 98.8°F | Resp 14 | Ht 70.0 in | Wt 179.0 lb

## 2017-11-02 DIAGNOSIS — Z23 Encounter for immunization: Secondary | ICD-10-CM

## 2017-11-02 DIAGNOSIS — M79671 Pain in right foot: Secondary | ICD-10-CM | POA: Diagnosis not present

## 2017-11-02 DIAGNOSIS — R21 Rash and other nonspecific skin eruption: Secondary | ICD-10-CM

## 2017-11-02 DIAGNOSIS — S99921A Unspecified injury of right foot, initial encounter: Secondary | ICD-10-CM | POA: Diagnosis not present

## 2017-11-02 MED ORDER — NYSTATIN 100000 UNIT/GM EX OINT: 1.0000 "application " | TOPICAL_OINTMENT | Freq: Two times a day (BID) | CUTANEOUS | 0 refills | Status: DC
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
## 2017-11-02 NOTE — Progress Notes (Signed)
Pre visit review using our clinic review tool, if applicable. No additional management support is needed unless otherwise documented below in the visit note. 

## 2017-11-02 NOTE — Patient Instructions (Signed)
Please speak with the front desk to get directions to [facility]. I will call with your results.   Please elevate foot while resting. Ibuprofen if needed for pain. Wear supportive foot wear. ACE wrap for compression.  For the inflamed gland under the left arm, apply warm compresses. Avoid picking at area. Let me know if this is not resolving.  For the right arm, apply the cream twice daily as directed. Keep skin clean and dry. Follow-up if not resolving.

## 2018-01-11 DIAGNOSIS — H52223 Regular astigmatism, bilateral: Secondary | ICD-10-CM | POA: Diagnosis not present

## 2018-01-11 DIAGNOSIS — H524 Presbyopia: Secondary | ICD-10-CM | POA: Diagnosis not present

## 2018-03-14 DIAGNOSIS — H0102A Squamous blepharitis right eye, upper and lower eyelids: Secondary | ICD-10-CM | POA: Diagnosis not present

## 2018-03-14 DIAGNOSIS — H0102B Squamous blepharitis left eye, upper and lower eyelids: Secondary | ICD-10-CM | POA: Diagnosis not present

## 2018-03-14 DIAGNOSIS — H16143 Punctate keratitis, bilateral: Secondary | ICD-10-CM | POA: Diagnosis not present

## 2018-04-05 DIAGNOSIS — H18831 Recurrent erosion of cornea, right eye: Secondary | ICD-10-CM | POA: Diagnosis not present

## 2018-04-12 DIAGNOSIS — L301 Dyshidrosis [pompholyx]: Secondary | ICD-10-CM | POA: Diagnosis not present

## 2018-04-12 DIAGNOSIS — L249 Irritant contact dermatitis, unspecified cause: Secondary | ICD-10-CM | POA: Diagnosis not present

## 2018-05-27 DIAGNOSIS — H18831 Recurrent erosion of cornea, right eye: Secondary | ICD-10-CM | POA: Diagnosis not present

## 2018-05-30 DIAGNOSIS — H18831 Recurrent erosion of cornea, right eye: Secondary | ICD-10-CM | POA: Diagnosis not present

## 2018-06-02 DIAGNOSIS — H18831 Recurrent erosion of cornea, right eye: Secondary | ICD-10-CM | POA: Diagnosis not present
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
## 2019-07-11 ENCOUNTER — Encounter: Payer: Self-pay | Admitting: Physician Assistant

## 2019-07-11 ENCOUNTER — Other Ambulatory Visit: Payer: Self-pay

## 2019-07-11 ENCOUNTER — Ambulatory Visit (INDEPENDENT_AMBULATORY_CARE_PROVIDER_SITE_OTHER): Payer: 59 | Admitting: Physician Assistant

## 2019-07-11 VITALS — BP 100/70 | HR 66 | Temp 98.4°F | Resp 14 | Ht 70.0 in | Wt 165.0 lb

## 2019-07-11 DIAGNOSIS — Z125 Encounter for screening for malignant neoplasm of prostate: Secondary | ICD-10-CM

## 2019-07-11 DIAGNOSIS — Z0000 Encounter for general adult medical examination without abnormal findings: Secondary | ICD-10-CM

## 2019-07-11 DIAGNOSIS — I440 Atrioventricular block, first degree: Secondary | ICD-10-CM

## 2019-07-11 LAB — CBC WITH DIFFERENTIAL/PLATELET
Basophils Absolute: 0.1 10*3/uL (ref 0.0–0.1)
Basophils Relative: 1.3 % (ref 0.0–3.0)
Eosinophils Absolute: 0.1 10*3/uL (ref 0.0–0.7)
Eosinophils Relative: 0.9 % (ref 0.0–5.0)
HCT: 43 % (ref 39.0–52.0)
Hemoglobin: 14.2 g/dL (ref 13.0–17.0)
Lymphocytes Relative: 23.5 % (ref 12.0–46.0)
Lymphs Abs: 1.9 10*3/uL (ref 0.7–4.0)
MCHC: 32.9 g/dL (ref 30.0–36.0)
MCV: 94.1 fl (ref 78.0–100.0)
Monocytes Absolute: 0.8 10*3/uL (ref 0.1–1.0)
Monocytes Relative: 9.9 % (ref 3.0–12.0)
Neutro Abs: 5.2 10*3/uL (ref 1.4–7.7)
Neutrophils Relative %: 64.4 % (ref 43.0–77.0)
Platelets: 355 10*3/uL (ref 150.0–400.0)
RBC: 4.58 Mil/uL (ref 4.22–5.81)
RDW: 13.3 % (ref 11.5–15.5)
WBC: 8.1 10*3/uL (ref 4.0–10.5)

## 2019-07-11 LAB — COMPREHENSIVE METABOLIC PANEL
ALT: 12 U/L (ref 0–53)
AST: 18 U/L (ref 0–37)
Albumin: 4.4 g/dL (ref 3.5–5.2)
Alkaline Phosphatase: 82 U/L (ref 39–117)
BUN: 12 mg/dL (ref 6–23)
CO2: 29 mEq/L (ref 19–32)
Calcium: 9.3 mg/dL (ref 8.4–10.5)
Chloride: 101 mEq/L (ref 96–112)
Creatinine, Ser: 0.94 mg/dL (ref 0.40–1.50)
GFR: 84.18 mL/min (ref >60.00)
Glucose, Bld: 92 mg/dL (ref 70–99)
Potassium: 4.8 mEq/L (ref 3.5–5.1)
Sodium: 139 mEq/L (ref 135–145)
Total Bilirubin: 0.7 mg/dL (ref 0.2–1.2)
Total Protein: 6.8 g/dL (ref 6.0–8.3)

## 2019-07-11 LAB — LIPID PANEL
Cholesterol: 233 mg/dL — ABNORMAL HIGH (ref 0–200)
HDL: 70.2 mg/dL (ref >39.00)
LDL Cholesterol: 150 mg/dL — ABNORMAL HIGH (ref 0–99)
NonHDL: 162.97
Total CHOL/HDL Ratio: 3
Triglycerides: 64 mg/dL (ref 0.0–149.0)
VLDL: 12.8 mg/dL (ref 0.0–40.0)

## 2019-07-11 LAB — HEMOGLOBIN A1C: Hgb A1c MFr Bld: 5.7 % (ref 4.6–6.5)

## 2019-07-11 LAB — TSH: TSH: 1.61 u[IU]/mL (ref 0.35–4.50)

## 2019-07-11 LAB — PSA: PSA: 0.34 ng/mL (ref 0.10–4.00)

## 2019-07-11 NOTE — Patient Instructions (Signed)
Please go to the lab for blood work.   Our office will call you with your results unless you have chosen to receive results via MyChart.  If your blood work is normal we will follow-up each year for physicals and as scheduled for chronic medical problems.  If anything is abnormal we will treat accordingly and get you in for a follow-up.  I will be getting records from your eye doctor for review.  If everything is stable we can start a trial of Restasis for the dry eye.    Preventive Care 2-90 Years Old, Male Preventive care refers to lifestyle choices and visits with your health care provider that can promote health and wellness. This includes:  A yearly physical exam. This is also called an annual well check.  Regular dental and eye exams.  Immunizations.  Screening for certain conditions.  Healthy lifestyle choices, such as eating a healthy diet, getting regular exercise, not using drugs or products that contain nicotine and tobacco, and limiting alcohol use. What can I expect for my preventive care visit? Physical exam Your health care provider will check:  Height and weight. These may be used to calculate body mass index (BMI), which is a measurement that tells if you are at a healthy weight.  Heart rate and blood pressure.  Your skin for abnormal spots. Counseling Your health care provider may ask you questions about:  Alcohol, tobacco, and drug use.  Emotional well-being.  Home and relationship well-being.  Sexual activity.  Eating habits.  Work and work Statistician. What immunizations do I need?  Influenza (flu) vaccine  This is recommended every year. Tetanus, diphtheria, and pertussis (Tdap) vaccine  You may need a Td booster every 10 years. Varicella (chickenpox) vaccine  You may need this vaccine if you have not already been vaccinated. Zoster (shingles) vaccine  You may need this after age 10. Measles, mumps, and rubella (MMR) vaccine  You  may need at least one dose of MMR if you were born in 1957 or later. You may also need a second dose. Pneumococcal conjugate (PCV13) vaccine  You may need this if you have certain conditions and were not previously vaccinated. Pneumococcal polysaccharide (PPSV23) vaccine  You may need one or two doses if you smoke cigarettes or if you have certain conditions. Meningococcal conjugate (MenACWY) vaccine  You may need this if you have certain conditions. Hepatitis A vaccine  You may need this if you have certain conditions or if you travel or work in places where you may be exposed to hepatitis A. Hepatitis B vaccine  You may need this if you have certain conditions or if you travel or work in places where you may be exposed to hepatitis B. Haemophilus influenzae type b (Hib) vaccine  You may need this if you have certain risk factors. Human papillomavirus (HPV) vaccine  If recommended by your health care provider, you may need three doses over 6 months. You may receive vaccines as individual doses or as more than one vaccine together in one shot (combination vaccines). Talk with your health care provider about the risks and benefits of combination vaccines. What tests do I need? Blood tests  Lipid and cholesterol levels. These may be checked every 5 years, or more frequently if you are over 29 years old.  Hepatitis C test.  Hepatitis B test. Screening  Lung cancer screening. You may have this screening every year starting at age 57 if you have a 30-pack-year history of smoking  and currently smoke or have quit within the past 15 years.  Prostate cancer screening. Recommendations will vary depending on your family history and other risks.  Colorectal cancer screening. All adults should have this screening starting at age 41 and continuing until age 91. Your health care provider may recommend screening at age 29 if you are at increased risk. You will have tests every 1-10 years,  depending on your results and the type of screening test.  Diabetes screening. This is done by checking your blood sugar (glucose) after you have not eaten for a while (fasting). You may have this done every 1-3 years.  Sexually transmitted disease (STD) testing. Follow these instructions at home: Eating and drinking  Eat a diet that includes fresh fruits and vegetables, whole grains, lean protein, and low-fat dairy products.  Take vitamin and mineral supplements as recommended by your health care provider.  Do not drink alcohol if your health care provider tells you not to drink.  If you drink alcohol: ? Limit how much you have to 0-2 drinks a day. ? Be aware of how much alcohol is in your drink. In the [place] one drink equals one 12 oz bottle of beer (355 mL), one 5 oz glass of wine (148 mL), or one 1 oz glass of hard liquor (44 mL). Lifestyle  Take daily care of your teeth and gums.  Stay active. Exercise for at least 30 minutes on 5 or more days each week.  Do not use any products that contain nicotine or tobacco, such as cigarettes, e-cigarettes, and chewing tobacco. If you need help quitting, ask your health care provider.  If you are sexually active, practice safe sex. Use a condom or other form of protection to prevent STIs (sexually transmitted infections).  Talk with your health care provider about taking a low-dose aspirin every day starting at age 89. What's next?  Go to your health care provider once a year for a well check visit.  Ask your health care provider how often you should have your eyes and teeth checked.  Stay up to date on all vaccines. This information is not intended to replace advice given to you by your health care provider. Make sure you discuss any questions you have with your health care provider. Document Released: 01/10/2016 Document Revised: 12/08/2018 Document Reviewed: 12/08/2018 Elsevier Patient Education  2020 [employer].
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
## 2019-07-12 ENCOUNTER — Other Ambulatory Visit: Payer: Self-pay

## 2019-07-12 DIAGNOSIS — E7800 Pure hypercholesterolemia, unspecified: Secondary | ICD-10-CM

## 2019-07-12 LAB — HIV ANTIBODY (ROUTINE TESTING W REFLEX): HIV 1&2 Ab, 4th Generation: NONREACTIVE

## 2020-01-10 ENCOUNTER — Ambulatory Visit (INDEPENDENT_AMBULATORY_CARE_PROVIDER_SITE_OTHER): Payer: 59 | Admitting: Emergency Medicine

## 2020-01-10 ENCOUNTER — Other Ambulatory Visit: Payer: Self-pay | Admitting: Physician Assistant

## 2020-01-10 ENCOUNTER — Other Ambulatory Visit: Payer: Self-pay

## 2020-01-10 DIAGNOSIS — E7800 Pure hypercholesterolemia, unspecified: Secondary | ICD-10-CM

## 2020-01-10 LAB — LIPID PANEL
Cholesterol: 251 mg/dL — ABNORMAL HIGH (ref 0–200)
HDL: 78.6 mg/dL (ref >39.00)
LDL Cholesterol: 160 mg/dL — ABNORMAL HIGH (ref 0–99)
NonHDL: 172.11
Total CHOL/HDL Ratio: 3
Triglycerides: 62 mg/dL (ref 0.0–149.0)
VLDL: 12.4 mg/dL (ref 0.0–40.0)

## 2020-07-12 ENCOUNTER — Encounter: Payer: Self-pay | Admitting: Physician Assistant

## 2020-07-12 ENCOUNTER — Other Ambulatory Visit: Payer: Self-pay

## 2020-07-12 ENCOUNTER — Ambulatory Visit (INDEPENDENT_AMBULATORY_CARE_PROVIDER_SITE_OTHER): Payer: 59 | Admitting: Physician Assistant

## 2020-07-12 VITALS — BP 120/80 | HR 72 | Temp 98.5°F | Resp 16 | Ht 70.0 in | Wt 161.0 lb

## 2020-07-12 DIAGNOSIS — Z125 Encounter for screening for malignant neoplasm of prostate: Secondary | ICD-10-CM

## 2020-07-12 DIAGNOSIS — Z0000 Encounter for general adult medical examination without abnormal findings: Secondary | ICD-10-CM | POA: Diagnosis not present

## 2020-07-12 DIAGNOSIS — Z1159 Encounter for screening for other viral diseases: Secondary | ICD-10-CM

## 2020-07-12 LAB — COMPREHENSIVE METABOLIC PANEL
ALT: 10 U/L (ref 0–53)
AST: 14 U/L (ref 0–37)
Albumin: 4.4 g/dL (ref 3.5–5.2)
Alkaline Phosphatase: 87 U/L (ref 39–117)
BUN: 19 mg/dL (ref 6–23)
CO2: 29 mEq/L (ref 19–32)
Calcium: 9.4 mg/dL (ref 8.4–10.5)
Chloride: 98 mEq/L (ref 96–112)
Creatinine, Ser: 0.94 mg/dL (ref 0.40–1.50)
GFR: 83.85 mL/min (ref >60.00)
Glucose, Bld: 96 mg/dL (ref 70–99)
Potassium: 4.6 mEq/L (ref 3.5–5.1)
Sodium: 137 mEq/L (ref 135–145)
Total Bilirubin: 0.5 mg/dL (ref 0.2–1.2)
Total Protein: 6.8 g/dL (ref 6.0–8.3)

## 2020-07-12 LAB — CBC WITH DIFFERENTIAL/PLATELET
Basophils Absolute: 0.1 10*3/uL (ref 0.0–0.1)
Basophils Relative: 0.9 % (ref 0.0–3.0)
Eosinophils Absolute: 0.1 10*3/uL (ref 0.0–0.7)
Eosinophils Relative: 0.6 % (ref 0.0–5.0)
HCT: 41.1 % (ref 39.0–52.0)
Hemoglobin: 13.8 g/dL (ref 13.0–17.0)
Lymphocytes Relative: 19.4 % (ref 12.0–46.0)
Lymphs Abs: 2 10*3/uL (ref 0.7–4.0)
MCHC: 33.6 g/dL (ref 30.0–36.0)
MCV: 92.1 fl (ref 78.0–100.0)
Monocytes Absolute: 0.9 10*3/uL (ref 0.1–1.0)
Monocytes Relative: 9 % (ref 3.0–12.0)
Neutro Abs: 7.3 10*3/uL (ref 1.4–7.7)
Neutrophils Relative %: 70.1 % (ref 43.0–77.0)
Platelets: 381 10*3/uL (ref 150.0–400.0)
RBC: 4.46 Mil/uL (ref 4.22–5.81)
RDW: 13.6 % (ref 11.5–15.5)
WBC: 10.4 10*3/uL (ref 4.0–10.5)

## 2020-07-12 LAB — PSA: PSA: 0.33 ng/mL (ref 0.10–4.00)

## 2020-07-12 LAB — LIPID PANEL
Cholesterol: 223 mg/dL — ABNORMAL HIGH (ref 0–200)
HDL: 77.8 mg/dL (ref >39.00)
LDL Cholesterol: 134 mg/dL — ABNORMAL HIGH (ref 0–99)
NonHDL: 145.51
Total CHOL/HDL Ratio: 3
Triglycerides: 57 mg/dL (ref 0.0–149.0)
VLDL: 11.4 mg/dL (ref 0.0–40.0)

## 2020-07-12 LAB — HEMOGLOBIN A1C: Hgb A1c MFr Bld: 5.8 % (ref 4.6–6.5)

## 2020-07-12 NOTE — Patient Instructions (Signed)
Please go to the lab for blood work.   Our office will call you with your results unless you have chosen to receive results via MyChart.  If your blood work is normal we will follow-up each year for physicals and as scheduled for chronic medical problems.  If anything is abnormal we will treat accordingly and get you in for a follow-up.  For lower back, apply heating pad to the area for 10-15 minutes each evening. I recommend starting low impact stretching -- like yoga or tai chi to help with flexibility. Also recommend starting an OTC daily Turmeric supplement as it is a natural anti-inflammatory. If symptoms are not improving I would like to proceed with imaging and PT assessment.   For sleep, consider starting OTC Pure ZZZs at night to help with more restful sleep. Follow the sleep hygiene practices below. Let me know how things are going.   Sleep Hygiene  Do: (1) Go to bed at the same time each day. (2) Get up from bed at the same time each day. (3) Get regular exercise each day, preferably in the morning.  There is goof evidence that regular exercise improves restful sleep.  This includes stretching and aerobic exercise. (4) Get regular exposure to outdoor or bright lights, especially in the late afternoon. (5) Keep the temperature in your bedroom comfortable. (6) Keep the bedroom quiet when sleeping. (7) Keep the bedroom dark enough to facilitate sleep. (8) Use your bed only for sleep and sex. (9) Take medications as directed.  It is helpful to take prescribed sleeping pills 1 hour before bedtime, so they are causing drowsiness when you lie down, or 10 hours before getting up, to avoid daytime drowsiness. (10) Use a relaxation exercise just before going to sleep -- imagery, massage, warm bath. (11) Keep your feet and hands warm.  Wear warm socks and/or mittens or gloves to bed.  Don't: (1) Exercise just before going to bed. (2) Engage in stimulating activity just before bed, such  as playing a competitive game, watching an exciting program on television, or having an important discussion with a loved one. (3) Have caffeine in the evening (coffee, teas, chocolate, sodas, etc.) (4) Read or watch television in bed. (5) Use alcohol to help you sleep. (6) Go to bed too hungry or too full. (7) Take another person's sleeping pills. (8) Take over-the-counter sleeping pills, without your doctor's knowledge.  Tolerance can develop rapidly with these medications.  Diphenhydramine can have serious side effects for elderly patients. (9) Take daytime naps. (10) Command yourself to go to sleep.  This only makes your mind and body more alert.  If you lie awake for more than 20-30 minutes, get up, go to a different room, participate in a quiet activity (Ex - non-excitable reading or television), and then return to bed when you feel sleepy.  Do this as many times during the night as needed.  This may cause you to have a night or two of poor sleep but it will train your brain to know when it is time for sleep.   Preventive Care 19-70 Years Old, Male Preventive care refers to lifestyle choices and visits with your health care provider that can promote health and wellness. This includes:  A yearly physical exam. This is also called an annual well check.  Regular dental and eye exams.  Immunizations.  Screening for certain conditions.  Healthy lifestyle choices, such as eating a healthy diet, getting regular exercise, not using drugs or  products that contain nicotine and tobacco, and limiting alcohol use. What can I expect for my preventive care visit? Physical exam Your health care provider will check:  Height and weight. These may be used to calculate body mass index (BMI), which is a measurement that tells if you are at a healthy weight.  Heart rate and blood pressure.  Your skin for abnormal spots. Counseling Your health care provider may ask you questions about:  Alcohol,  tobacco, and drug use.  Emotional well-being.  Home and relationship well-being.  Sexual activity.  Eating habits.  Work and work Statistician. What immunizations do I need?  Influenza (flu) vaccine  This is recommended every year. Tetanus, diphtheria, and pertussis (Tdap) vaccine  You may need a Td booster every 10 years. Varicella (chickenpox) vaccine  You may need this vaccine if you have not already been vaccinated. Zoster (shingles) vaccine  You may need this after age 4. Measles, mumps, and rubella (MMR) vaccine  You may need at least one dose of MMR if you were born in 1957 or later. You may also need a second dose. Pneumococcal conjugate (PCV13) vaccine  You may need this if you have certain conditions and were not previously vaccinated. Pneumococcal polysaccharide (PPSV23) vaccine  You may need one or two doses if you smoke cigarettes or if you have certain conditions. Meningococcal conjugate (MenACWY) vaccine  You may need this if you have certain conditions. Hepatitis A vaccine  You may need this if you have certain conditions or if you travel or work in places where you may be exposed to hepatitis A. Hepatitis B vaccine  You may need this if you have certain conditions or if you travel or work in places where you may be exposed to hepatitis B. Haemophilus influenzae type b (Hib) vaccine  You may need this if you have certain risk factors. Human papillomavirus (HPV) vaccine  If recommended by your health care provider, you may need three doses over 6 months. You may receive vaccines as individual doses or as more than one vaccine together in one shot (combination vaccines). Talk with your health care provider about the risks and benefits of combination vaccines. What tests do I need? Blood tests  Lipid and cholesterol levels. These may be checked every 5 years, or more frequently if you are over 97 years old.  Hepatitis C test.  Hepatitis B  test. Screening  Lung cancer screening. You may have this screening every year starting at age 38 if you have a 30-pack-year history of smoking and currently smoke or have quit within the past 15 years.  Prostate cancer screening. Recommendations will vary depending on your family history and other risks.  Colorectal cancer screening. All adults should have this screening starting at age 11 and continuing until age 68. Your health care provider may recommend screening at age 66 if you are at increased risk. You will have tests every 1-10 years, depending on your results and the type of screening test.  Diabetes screening. This is done by checking your blood sugar (glucose) after you have not eaten for a while (fasting). You may have this done every 1-3 years.  Sexually transmitted disease (STD) testing. Follow these instructions at home: Eating and drinking  Eat a diet that includes fresh fruits and vegetables, whole grains, lean protein, and low-fat dairy products.  Take vitamin and mineral supplements as recommended by your health care provider.  Do not drink alcohol if your health care provider tells you  not to drink.  If you drink alcohol: ? Limit how much you have to 0-2 drinks a day. ? Be aware of how much alcohol is in your drink. In the [place] one drink equals one 12 oz bottle of beer (355 mL), one 5 oz glass of wine (148 mL), or one 1 oz glass of hard liquor (44 mL). Lifestyle  Take daily care of your teeth and gums.  Stay active. Exercise for at least 30 minutes on 5 or more days each week.  Do not use any products that contain nicotine or tobacco, such as cigarettes, e-cigarettes, and chewing tobacco. If you need help quitting, ask your health care provider.  If you are sexually active, practice safe sex. Use a condom or other form of protection to prevent STIs (sexually transmitted infections).  Talk with your health care provider about taking a low-dose aspirin every  day starting at age 51. What's next?  Go to your health care provider once a year for a well check visit.  Ask your health care provider how often you should have your eyes and teeth checked.  Stay up to date on all vaccines. This information is not intended to replace advice given to you by your health care provider. Make sure you discuss any questions you have with your health care provider. Document Revised: 12/08/2018 Document Reviewed: 12/08/2018 Elsevier Patient Education  2020 [employer].
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
## 2020-07-15 LAB — HEPATITIS C ANTIBODY
Hepatitis C Ab: NONREACTIVE
SIGNAL TO CUT-OFF: 0 (ref <1.00)

## 2020-07-16 ENCOUNTER — Encounter: Payer: Self-pay | Admitting: Emergency Medicine

## 2020-09-11 ENCOUNTER — Other Ambulatory Visit: Payer: 59

## 2021-06-04 ENCOUNTER — Encounter (HOSPITAL_COMMUNITY): Payer: Self-pay

## 2021-06-04 ENCOUNTER — Other Ambulatory Visit: Payer: Self-pay

## 2021-06-04 ENCOUNTER — Emergency Department (HOSPITAL_COMMUNITY)
Admission: EM | Admit: 2021-06-04 | Discharge: 2021-06-04 | Disposition: A | Discharge status: Home / Self Care | Payer: 59 | Attending: Emergency Medicine | Admitting: Emergency Medicine

## 2021-06-04 DIAGNOSIS — Z203 Contact with and (suspected) exposure to rabies: Secondary | ICD-10-CM | POA: Insufficient documentation

## 2021-06-04 DIAGNOSIS — R030 Elevated blood-pressure reading, without diagnosis of hypertension: Secondary | ICD-10-CM

## 2021-06-04 DIAGNOSIS — Z87891 Personal history of nicotine dependence: Secondary | ICD-10-CM | POA: Diagnosis not present

## 2021-06-04 DIAGNOSIS — Z0000 Encounter for general adult medical examination without abnormal findings: Secondary | ICD-10-CM

## 2021-06-04 DIAGNOSIS — Z7982 Long term (current) use of aspirin: Secondary | ICD-10-CM | POA: Diagnosis not present

## 2021-06-04 MED ORDER — TETANUS-DIPHTH-ACELL PERTUSSIS 5-2.5-18.5 LF-MCG/0.5 IM SUSY: 0.5000 mL | PREFILLED_SYRINGE | Freq: Once | INTRAMUSCULAR | Status: DC

## 2021-06-04 MED ORDER — RABIES IMMUNE GLOBULIN 150 UNIT/ML IM INJ
20.0000 [IU]/kg | INJECTION | Freq: Once | INTRAMUSCULAR | Status: DC
  Filled 2021-06-04 (×2): qty 10

## 2021-06-04 MED ORDER — RABIES VACCINE, PCEC IM SUSR
1.0000 mL | Freq: Once | INTRAMUSCULAR | Status: DC
  Filled 2021-06-04: qty 1

## 2021-06-04 NOTE — ED Notes (Signed)
Patient verbalizes understanding of discharge instructions. Opportunity for questioning and answers were provided. Armband removed by staff, pt discharged from ED and ambulated to lobby to return home.   
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
## 2021-06-04 NOTE — ED Triage Notes (Signed)
Patient awoke to bat flying around bedroom this am, no complaints. Here for possible rabies vaccine.

## 2021-06-04 NOTE — ED Provider Notes (Signed)
Emergency Medicine Provider Triage Evaluation Note  Jerry Harrison , a 55 y.o. male  was evaluated in triage.  Pt woke up this morning found a bat in his room, patient is concerned it may have bit him in the night without him noticing.  Patient would like rabies vaccine today.  No other concerns.  Review of Systems  Positive: Bat found in room Negative: Fever, chills,weakness, tingling, bleeding/bruising.  Physical Exam  BP (!) 150/92 (BP Location: Left Arm)   Pulse 71   Temp 99 F (37.2 C)   Resp 16   Wt 73.1 kg   SpO2 96%   BMI 23.14 kg/m  Gen:   Awake, no distress   Resp:  Normal effort  MSK:   Moves extremities without difficulty  Other:  Alert, oriented, steady gait  Medical Decision Making  Medically screening exam initiated at 9:21 AM.  Appropriate orders placed.  TOWNES FUHS was informed that the remainder of the evaluation will be completed by another provider, this initial triage assessment does not replace that evaluation, and the importance of remaining in the ED until their evaluation is complete.   Note: Portions of this report may have been transcribed using voice recognition software. Every effort was made to ensure accuracy; however, inadvertent computerized transcription errors may still be present.    Elizabeth Palau 06/04/21 1950    Terrilee Files, MD 06/04/21 2108

## 2021-06-04 NOTE — Discharge Instructions (Signed)
It was our pleasure to provide your ER care today - we hope that you feel better.  Follow up with primary care doctor if any concern.  Your blood pressure is mildly high this morning - follow up with primary care doctor.

## 2021-08-28 ENCOUNTER — Ambulatory Visit: Payer: 59
